# Patient Record
Sex: Female | Born: 1980 | Race: Black or African American | Hispanic: No | Marital: Single | Smoking: Never smoker
Health system: Southern US, Community
[De-identification: ages and names within clinical notes are randomized; demographics above are authoritative.]

---

## 2004-01-30 ENCOUNTER — Encounter (INDEPENDENT_AMBULATORY_CARE_PROVIDER_SITE_OTHER): Payer: Self-pay | Admitting: Specialist

## 2004-01-30 ENCOUNTER — Ambulatory Visit: Payer: Self-pay | Admitting: Obstetrics & Gynecology

## 2004-02-02 ENCOUNTER — Ambulatory Visit (HOSPITAL_COMMUNITY): Admission: RE | Admit: 2004-02-02 | Discharge: 2004-02-02 | Payer: Self-pay | Admitting: *Deleted

## 2004-02-20 ENCOUNTER — Ambulatory Visit: Payer: Self-pay | Admitting: Obstetrics & Gynecology

## 2004-10-01 ENCOUNTER — Inpatient Hospital Stay (HOSPITAL_COMMUNITY): Admission: AD | Admit: 2004-10-01 | Discharge: 2004-10-02 | Payer: Self-pay | Admitting: *Deleted

## 2004-10-31 ENCOUNTER — Ambulatory Visit: Payer: Self-pay | Admitting: Family Medicine

## 2004-11-18 ENCOUNTER — Ambulatory Visit: Payer: Self-pay | Admitting: Family Medicine

## 2004-12-01 ENCOUNTER — Ambulatory Visit: Payer: Self-pay | Admitting: *Deleted

## 2005-01-16 ENCOUNTER — Encounter (INDEPENDENT_AMBULATORY_CARE_PROVIDER_SITE_OTHER): Payer: Self-pay | Admitting: *Deleted

## 2005-01-16 ENCOUNTER — Ambulatory Visit: Payer: Self-pay | Admitting: Obstetrics & Gynecology

## 2005-03-10 ENCOUNTER — Inpatient Hospital Stay (HOSPITAL_COMMUNITY): Admission: AD | Admit: 2005-03-10 | Discharge: 2005-03-10 | Payer: Self-pay | Admitting: Obstetrics & Gynecology

## 2005-04-07 ENCOUNTER — Inpatient Hospital Stay (HOSPITAL_COMMUNITY): Admission: AD | Admit: 2005-04-07 | Discharge: 2005-04-08 | Payer: Self-pay | Admitting: Obstetrics & Gynecology

## 2005-05-27 ENCOUNTER — Ambulatory Visit (HOSPITAL_COMMUNITY): Admission: RE | Admit: 2005-05-27 | Discharge: 2005-05-27 | Payer: Self-pay | Admitting: *Deleted

## 2005-07-21 ENCOUNTER — Ambulatory Visit: Payer: Self-pay | Admitting: Obstetrics and Gynecology

## 2005-07-21 ENCOUNTER — Inpatient Hospital Stay (HOSPITAL_COMMUNITY): Admission: AD | Admit: 2005-07-21 | Discharge: 2005-07-21 | Payer: Self-pay | Admitting: *Deleted

## 2005-10-28 ENCOUNTER — Inpatient Hospital Stay (HOSPITAL_COMMUNITY): Admission: AD | Admit: 2005-10-28 | Discharge: 2005-10-28 | Payer: Self-pay | Admitting: Obstetrics and Gynecology

## 2005-10-29 ENCOUNTER — Ambulatory Visit: Payer: Self-pay | Admitting: Obstetrics & Gynecology

## 2005-10-29 ENCOUNTER — Inpatient Hospital Stay (HOSPITAL_COMMUNITY): Admission: AD | Admit: 2005-10-29 | Discharge: 2005-11-01 | Payer: Self-pay | Admitting: Gynecology

## 2005-10-29 ENCOUNTER — Encounter (INDEPENDENT_AMBULATORY_CARE_PROVIDER_SITE_OTHER): Payer: Self-pay | Admitting: *Deleted

## 2005-10-29 ENCOUNTER — Ambulatory Visit: Payer: Self-pay | Admitting: Neonatology

## 2006-11-10 ENCOUNTER — Ambulatory Visit (HOSPITAL_COMMUNITY): Admission: RE | Admit: 2006-11-10 | Discharge: 2006-11-10 | Payer: Self-pay | Admitting: Obstetrics & Gynecology

## 2006-11-24 ENCOUNTER — Ambulatory Visit (HOSPITAL_COMMUNITY): Admission: RE | Admit: 2006-11-24 | Discharge: 2006-11-24 | Payer: Self-pay | Admitting: Obstetrics & Gynecology

## 2006-12-31 IMAGING — US US OB LIMITED
1 series · 14 of 28 positions shown · non-contrast
Comparison: none

CLINICAL DATA: Post dates.  40 week 1 day assigned gestational age.  Vaginal bleeding.  Evaluate placenta.

[Series 1: us ob limited · 14 of 29 slices shown]
[im 2/29]
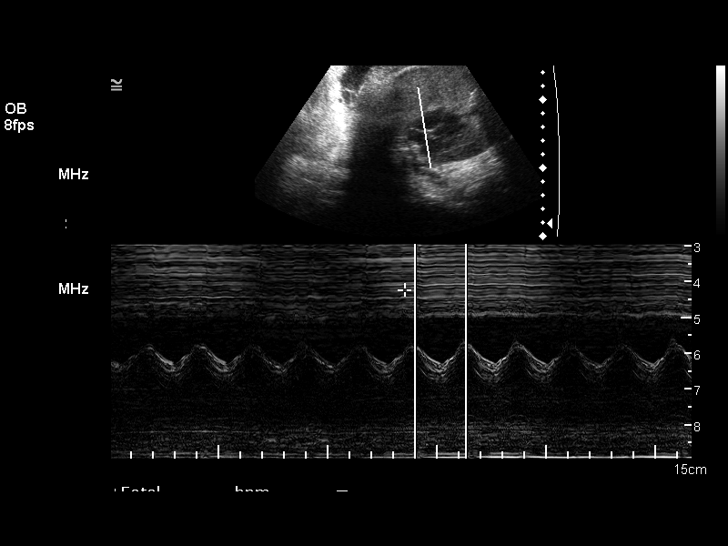
[im 4/29]
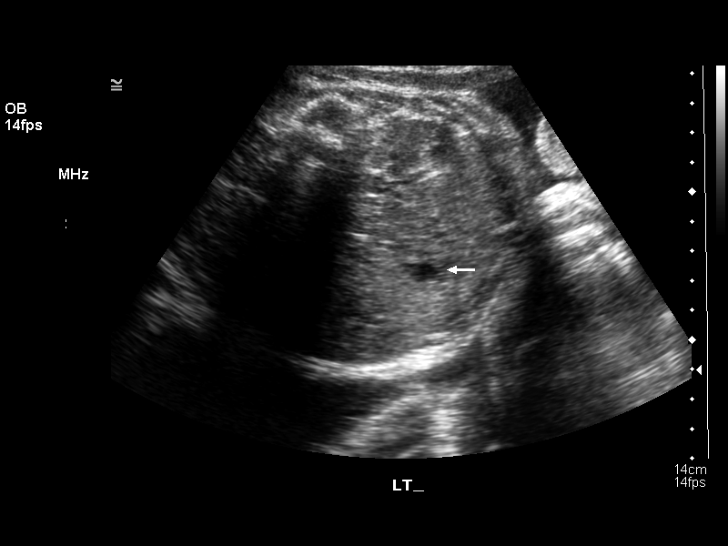
[im 6/29]
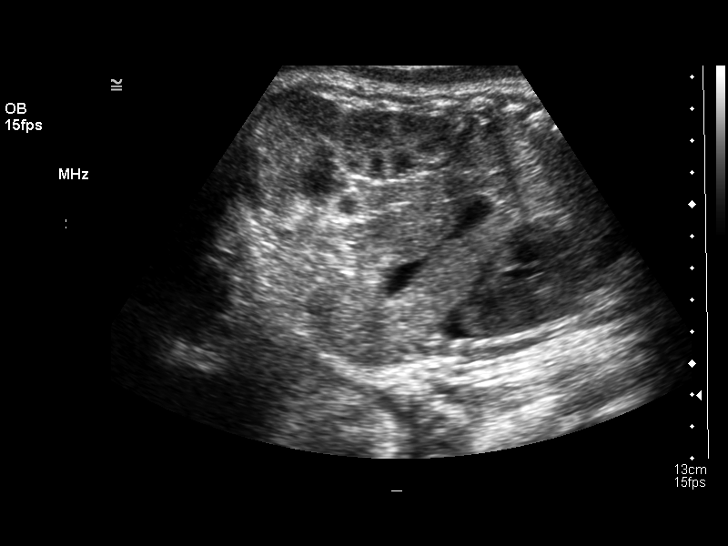
[im 8/29]
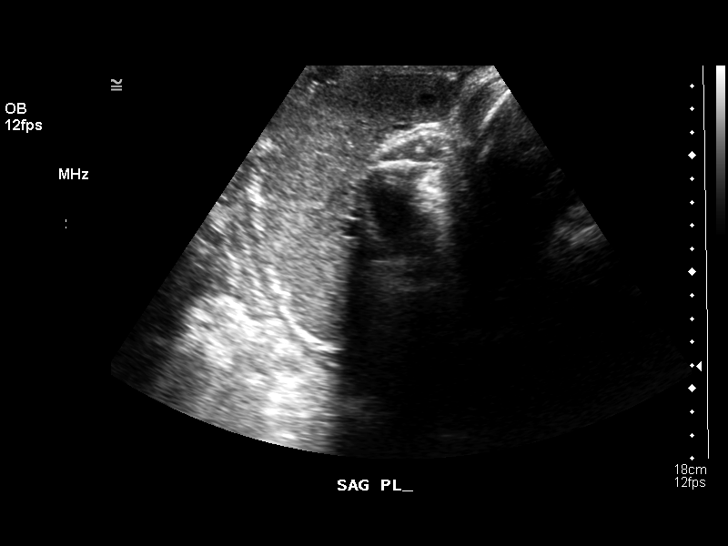
[im 10/29]
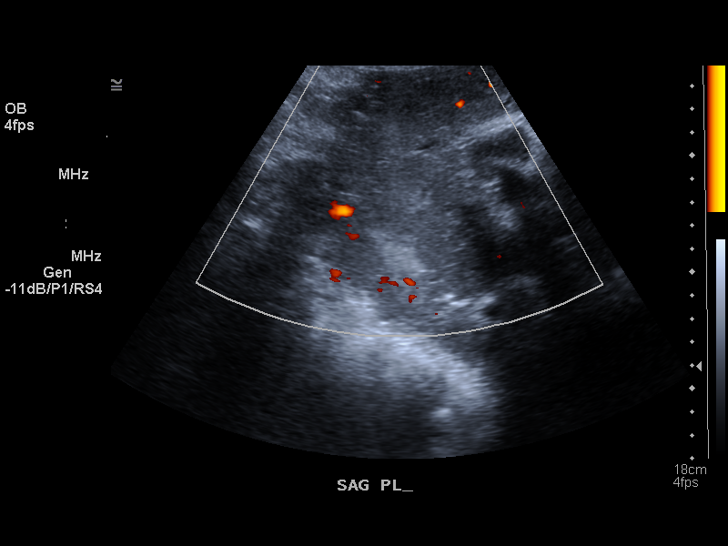
[im 12/29]
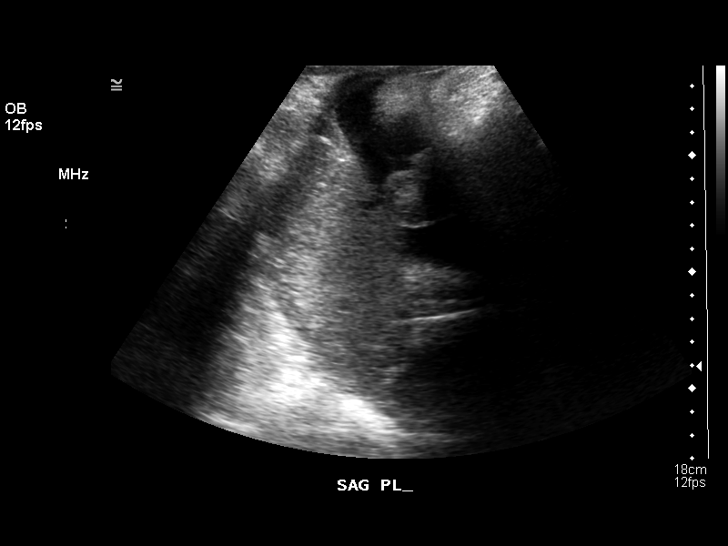
[im 14/29]
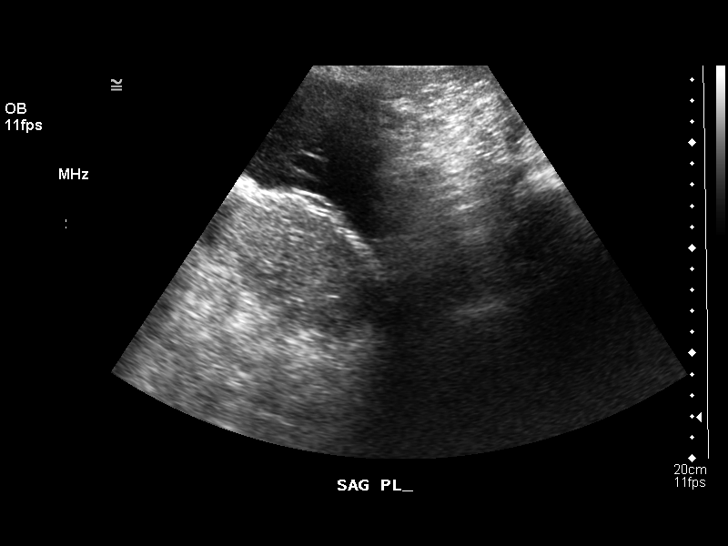
[im 16/29]
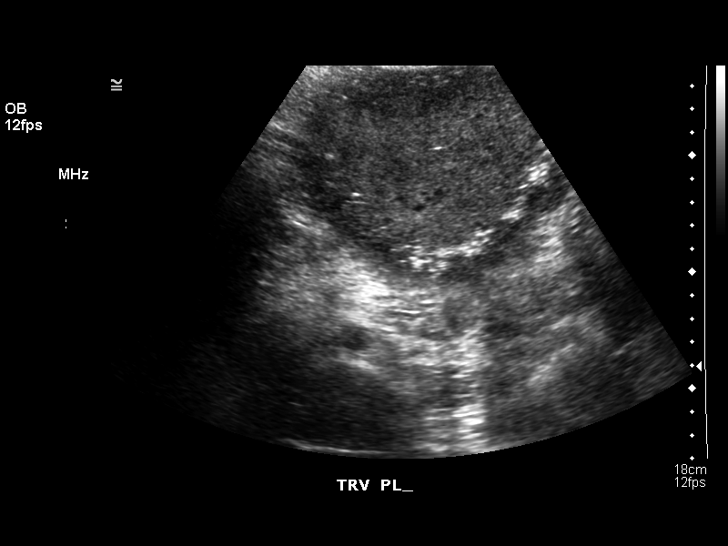
[im 18/29]
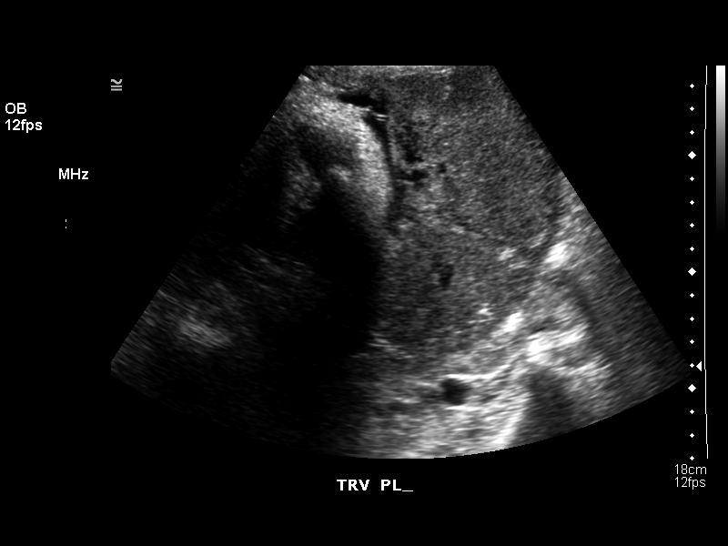
[im 20/29]
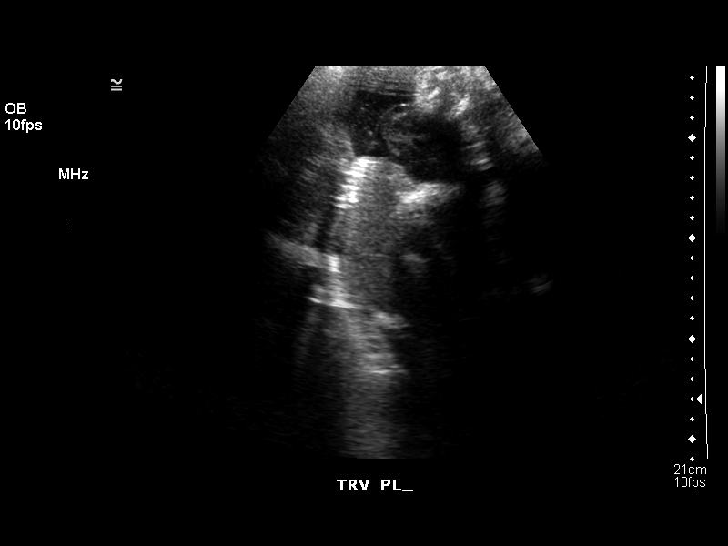
[im 22/29]
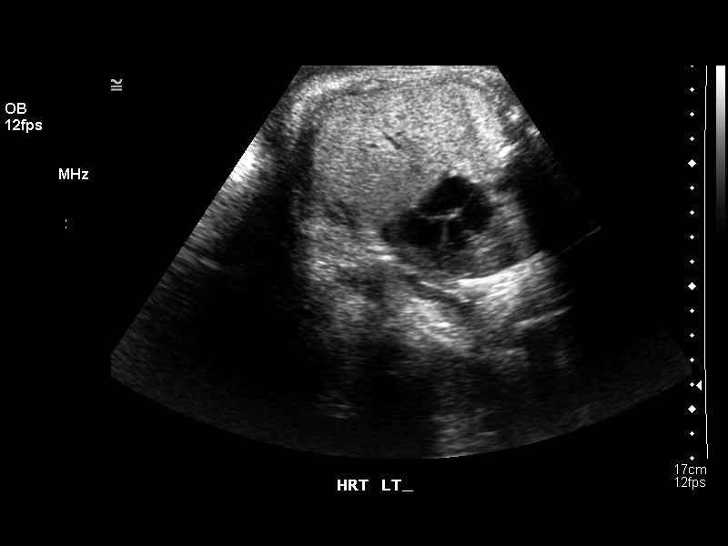
[im 24/29]
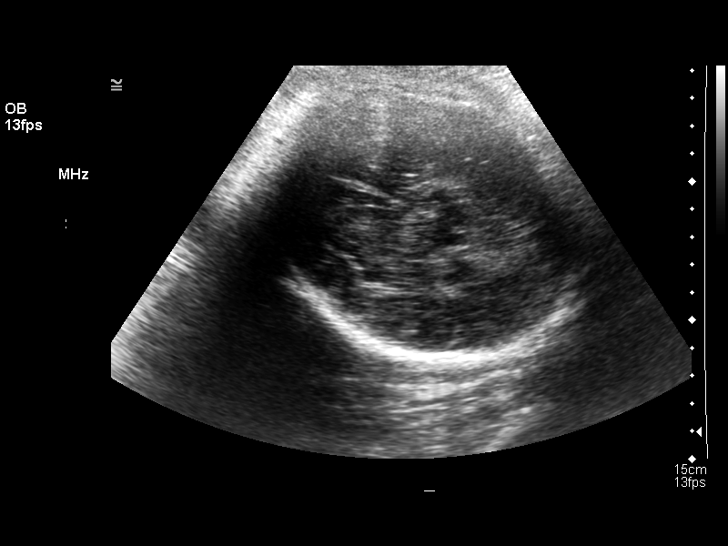
[im 26/29]
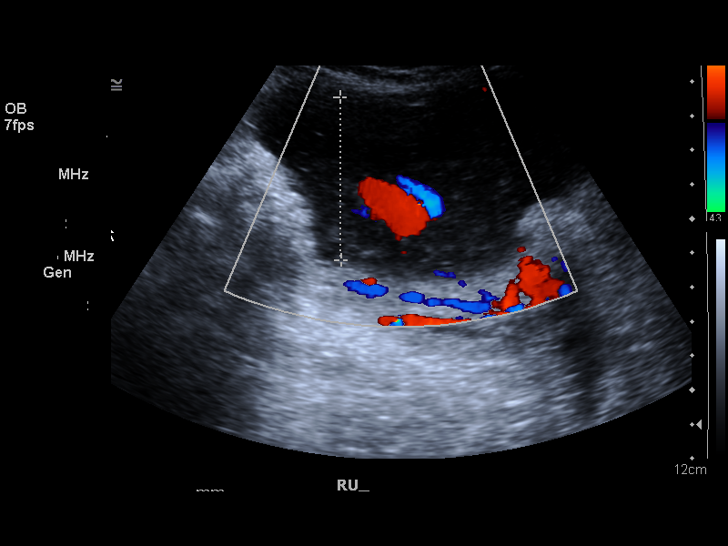
[im 29/29]
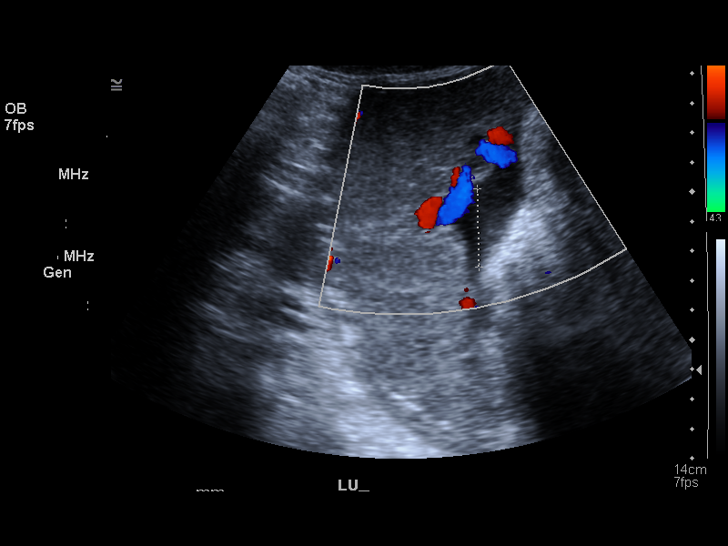

[14 of 28 positions shown; findings below may reference images not displayed]

LIMITED OBSTETRICAL ULTRASOUND:
 Number of Fetuses:  1
 Heart Rate:  128 bpm
 Movement:  Yes
 Breathing:  Yes
 Presentation:  Cephalic
 Placental Location:  Fundal, posterior
 Grade:  II
 Previa:  No
 Amniotic Fluid (Subjective):  Normal
 Amniotic Fluid (Objective):  AFI 13.4 cm (1th-61th %ile = 7.1 to 21.4 cm for 40 weeks) 

 Fetal measurements and complete anatomic evaluation were not requested.  The following fetal anatomy was visualized during this exam:  Lateral ventricles, thalami/CSP, stomach, 3-vessel cord, kidneys, bladder, and diaphragm.   

 MATERNAL UTERINE AND ADNEXAL FINDINGS
 Cervix:  Not evaluated; >34 weeks.
IMPRESSION: 1.  Single living intrauterine fetus in cephalic presentation.  
 2.  Placenta is posterior and fundal in location.  No evidence of placenta previa or abruption.

## 2007-04-04 ENCOUNTER — Ambulatory Visit: Payer: Self-pay | Admitting: Obstetrics and Gynecology

## 2007-04-04 ENCOUNTER — Inpatient Hospital Stay (HOSPITAL_COMMUNITY): Admission: AD | Admit: 2007-04-04 | Discharge: 2007-04-06 | Payer: Self-pay | Admitting: Obstetrics and Gynecology

## 2008-03-08 ENCOUNTER — Inpatient Hospital Stay (HOSPITAL_COMMUNITY): Admission: AD | Admit: 2008-03-08 | Discharge: 2008-03-09 | Payer: Self-pay | Admitting: Obstetrics and Gynecology

## 2008-04-17 ENCOUNTER — Ambulatory Visit (HOSPITAL_COMMUNITY): Admission: RE | Admit: 2008-04-17 | Discharge: 2008-04-17 | Payer: Self-pay | Admitting: Obstetrics & Gynecology

## 2008-05-15 ENCOUNTER — Ambulatory Visit (HOSPITAL_COMMUNITY): Admission: RE | Admit: 2008-05-15 | Discharge: 2008-05-15 | Payer: Self-pay | Admitting: *Deleted

## 2008-05-15 ENCOUNTER — Ambulatory Visit: Payer: Self-pay | Admitting: Obstetrics & Gynecology

## 2008-05-15 ENCOUNTER — Encounter: Payer: Self-pay | Admitting: Obstetrics and Gynecology

## 2008-05-15 LAB — CONVERTED CEMR LAB
MCHC: 33.8 g/dL (ref 30.0–36.0)
MCV: 86.6 fL (ref 78.0–100.0)
Platelets: 107 10*3/uL — ABNORMAL LOW (ref 150–400)
RDW: 13 % (ref 11.5–15.5)
WBC: 6.2 10*3/uL (ref 4.0–10.5)

## 2008-05-22 ENCOUNTER — Ambulatory Visit: Payer: Self-pay | Admitting: Family Medicine

## 2008-06-05 ENCOUNTER — Ambulatory Visit: Payer: Self-pay | Admitting: Obstetrics & Gynecology

## 2008-06-26 ENCOUNTER — Ambulatory Visit: Payer: Self-pay | Admitting: Obstetrics & Gynecology

## 2008-06-26 ENCOUNTER — Encounter: Payer: Self-pay | Admitting: Obstetrics and Gynecology

## 2008-06-26 LAB — CONVERTED CEMR LAB
MCHC: 33.6 g/dL (ref 30.0–36.0)
Platelets: 108 10*3/uL — ABNORMAL LOW (ref 150–400)
RDW: 12.9 % (ref 11.5–15.5)

## 2008-07-10 ENCOUNTER — Ambulatory Visit: Payer: Self-pay | Admitting: Obstetrics & Gynecology

## 2008-07-17 ENCOUNTER — Ambulatory Visit: Payer: Self-pay | Admitting: Obstetrics & Gynecology

## 2008-07-17 LAB — CONVERTED CEMR LAB
Chlamydia, DNA Probe: NEGATIVE
GC Probe Amp, Genital: NEGATIVE

## 2008-07-18 ENCOUNTER — Encounter: Payer: Self-pay | Admitting: Obstetrics & Gynecology

## 2008-07-24 ENCOUNTER — Encounter: Payer: Self-pay | Admitting: Obstetrics and Gynecology

## 2008-07-24 ENCOUNTER — Ambulatory Visit: Payer: Self-pay | Admitting: Obstetrics & Gynecology

## 2008-07-24 LAB — CONVERTED CEMR LAB
MCHC: 34.7 g/dL (ref 30.0–36.0)
Platelets: 113 10*3/uL — ABNORMAL LOW (ref 150–400)
RDW: 13.1 % (ref 11.5–15.5)

## 2008-07-31 ENCOUNTER — Ambulatory Visit: Payer: Self-pay | Admitting: Obstetrics & Gynecology

## 2008-08-03 ENCOUNTER — Ambulatory Visit: Payer: Self-pay | Admitting: Obstetrics & Gynecology

## 2008-08-03 ENCOUNTER — Inpatient Hospital Stay (HOSPITAL_COMMUNITY): Admission: AD | Admit: 2008-08-03 | Discharge: 2008-08-05 | Payer: Self-pay | Admitting: Obstetrics & Gynecology

## 2009-06-20 IMAGING — US US OB FOLLOW-UP
1 series · 14 of 28 positions shown · non-contrast
Comparison: none

OBSTETRICAL ULTRASOUND:
 This ultrasound exam was performed in the [HOSPITAL] Ultrasound Department.  The OB US report was generated in the AS system, and faxed to the ordering physician.  This report is also available in [REDACTED] PACS.

[Series 1: us ob follow up · 14 of 53 slices shown]
[im 2/53]
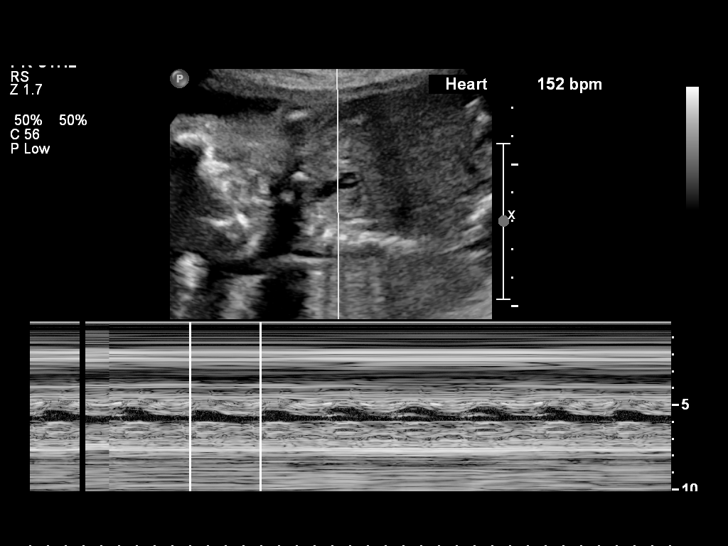
[im 6/53]
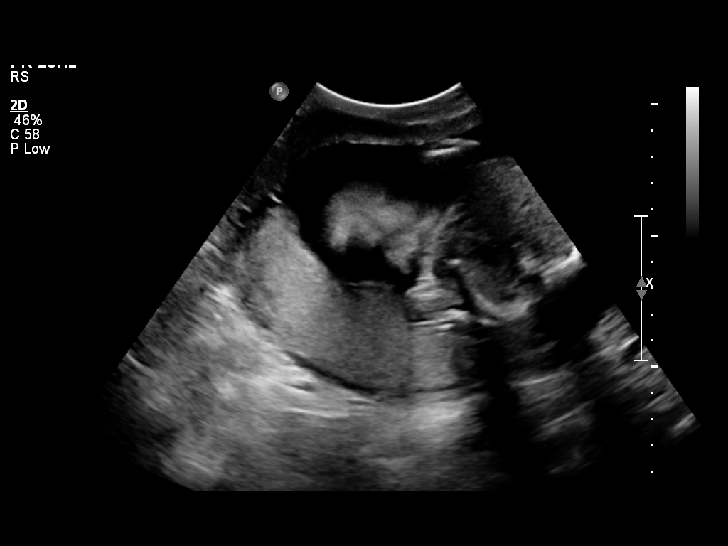
[im 10/53]
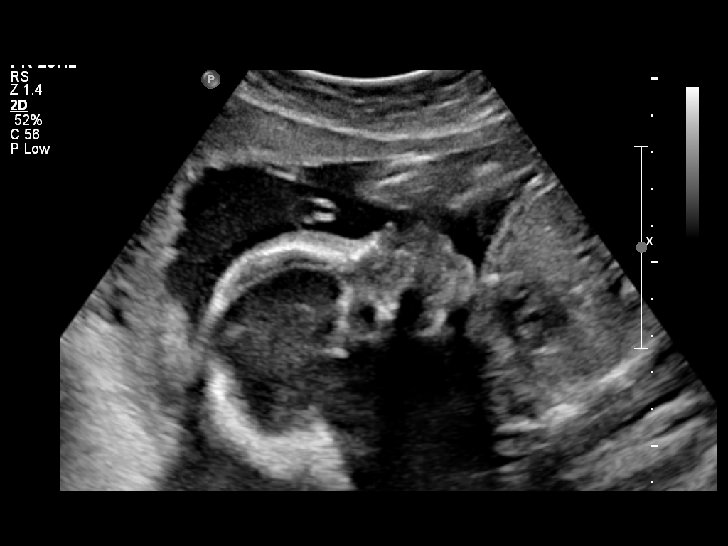
[im 14/53]
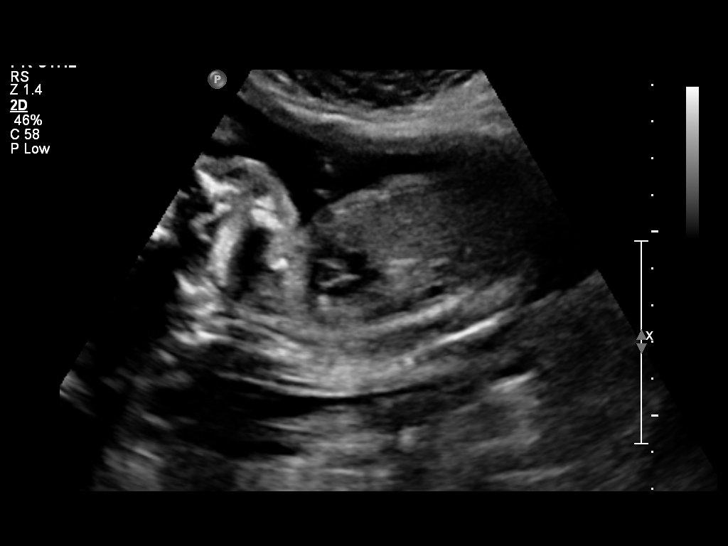
[im 18/53]
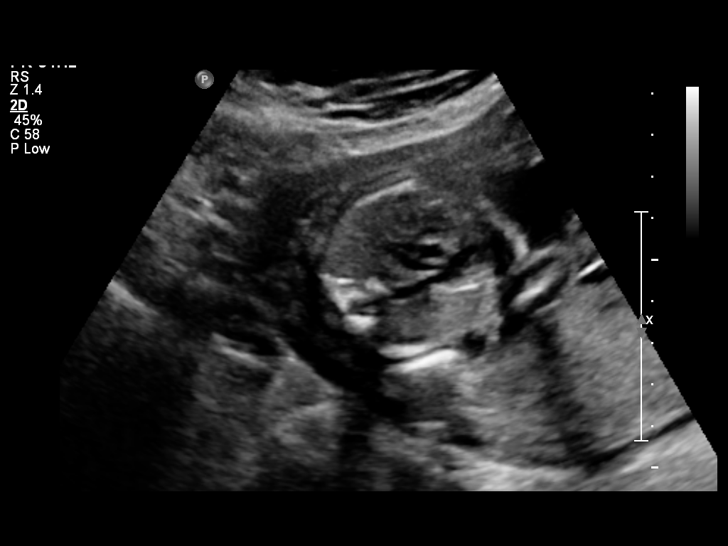
[im 22/53]
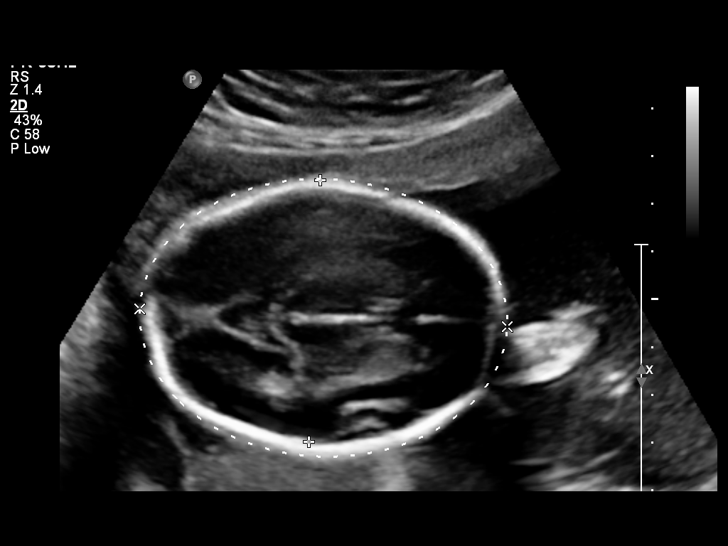
[im 26/53]
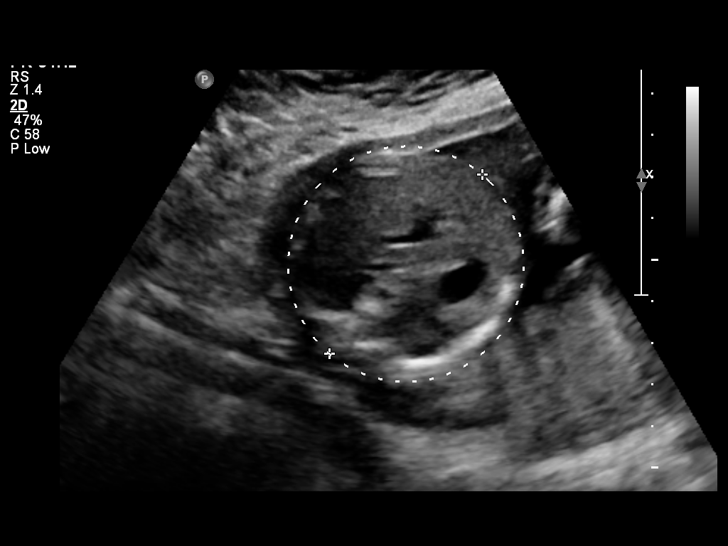
[im 29/53]
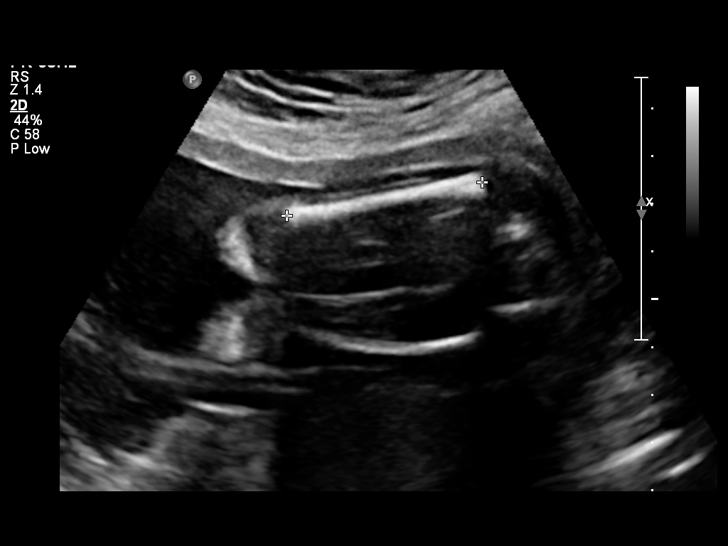
[im 33/53]
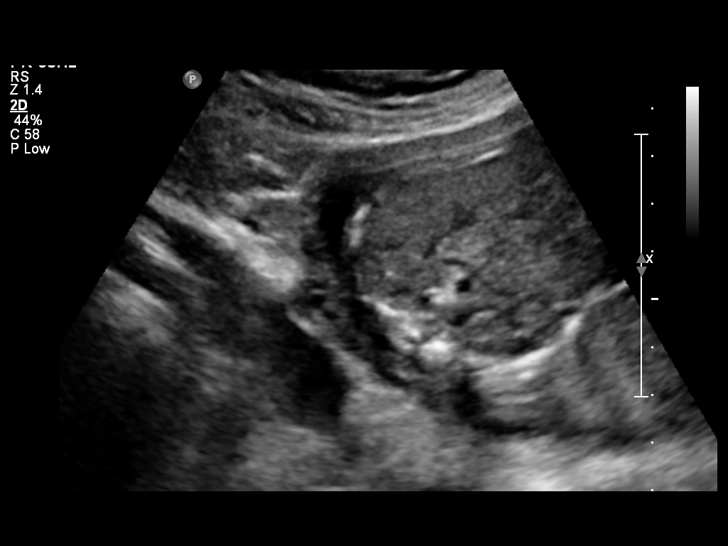
[im 37/53]
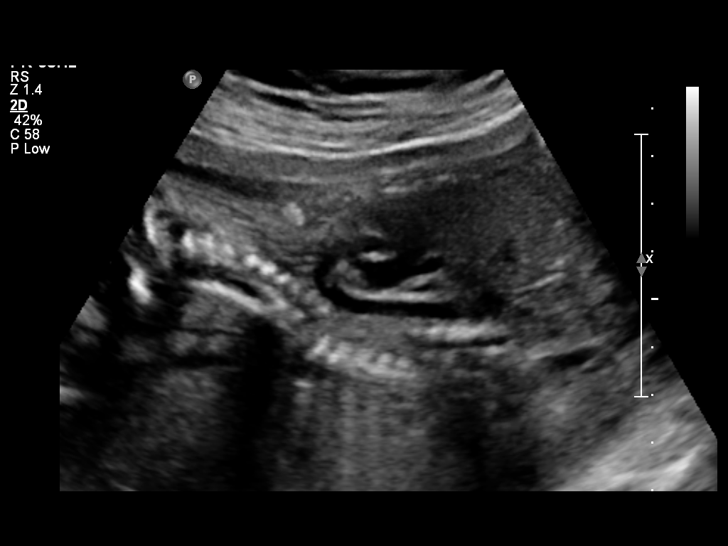
[im 41/53]
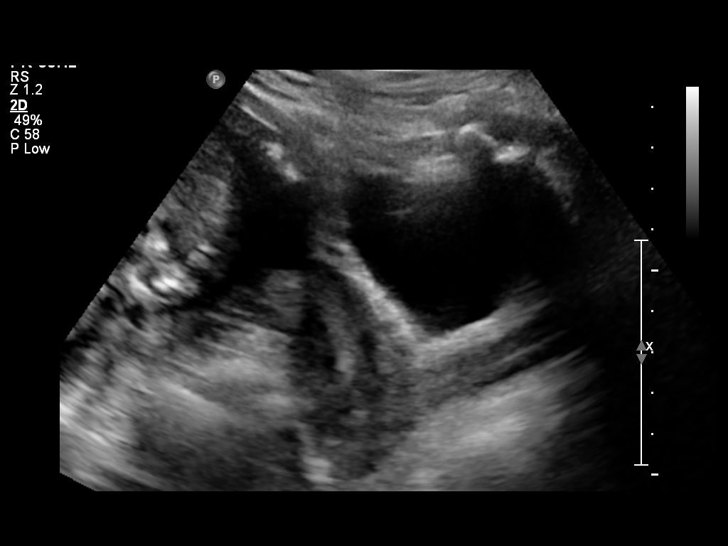
[im 45/53]
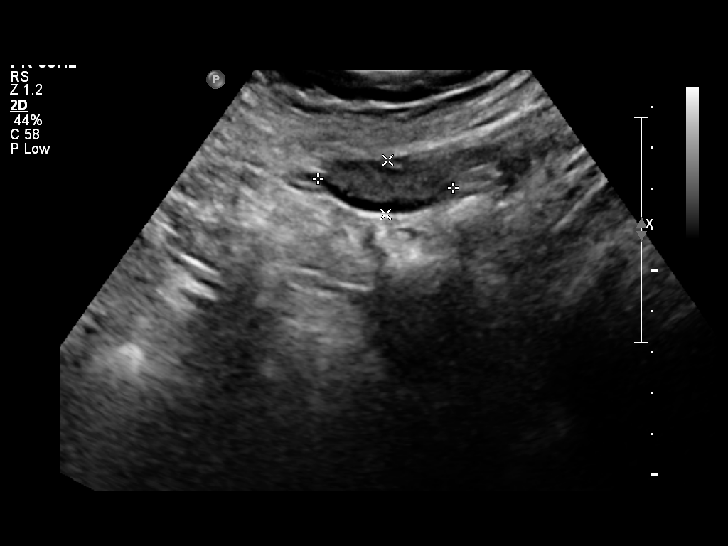
[im 49/53]
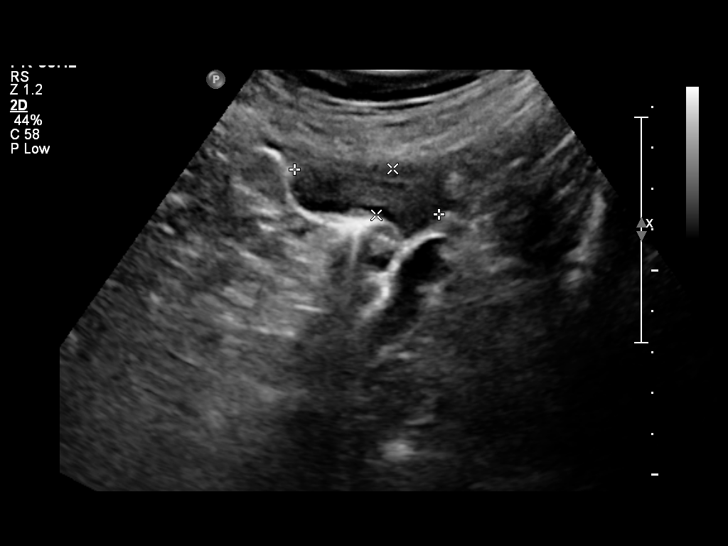
[im 53/53]
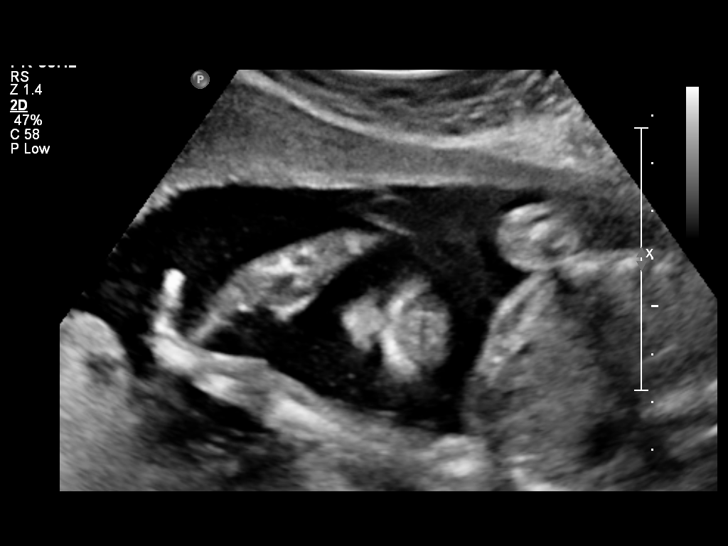

[14 of 28 positions shown; findings below may reference images not displayed]

IMPRESSION: See AS Obstetric US report.

## 2010-04-28 LAB — POCT URINALYSIS DIP (DEVICE)
Bilirubin Urine: NEGATIVE
Bilirubin Urine: NEGATIVE
Glucose, UA: NEGATIVE mg/dL
Ketones, ur: NEGATIVE mg/dL
Nitrite: NEGATIVE
Nitrite: NEGATIVE
Protein, ur: NEGATIVE mg/dL
Urobilinogen, UA: 0.2 mg/dL (ref 0.0–1.0)

## 2010-04-29 LAB — POCT URINALYSIS DIP (DEVICE)
Bilirubin Urine: NEGATIVE
Bilirubin Urine: NEGATIVE
Glucose, UA: NEGATIVE mg/dL
Glucose, UA: NEGATIVE mg/dL
Glucose, UA: NEGATIVE mg/dL
Hgb urine dipstick: NEGATIVE
Ketones, ur: NEGATIVE mg/dL
Ketones, ur: NEGATIVE mg/dL
Nitrite: NEGATIVE
Nitrite: NEGATIVE
Protein, ur: NEGATIVE mg/dL
Specific Gravity, Urine: 1.02 (ref 1.005–1.030)
Urobilinogen, UA: 0.2 mg/dL (ref 0.0–1.0)
Urobilinogen, UA: 0.2 mg/dL (ref 0.0–1.0)
pH: 6 (ref 5.0–8.0)

## 2010-04-30 LAB — POCT URINALYSIS DIP (DEVICE)
Bilirubin Urine: NEGATIVE
Bilirubin Urine: NEGATIVE
Glucose, UA: NEGATIVE mg/dL
Glucose, UA: NEGATIVE mg/dL
Hgb urine dipstick: NEGATIVE
Ketones, ur: NEGATIVE mg/dL
Ketones, ur: NEGATIVE mg/dL
Protein, ur: NEGATIVE mg/dL
Specific Gravity, Urine: 1.015 (ref 1.005–1.030)
Specific Gravity, Urine: 1.015 (ref 1.005–1.030)
Urobilinogen, UA: 0.2 mg/dL (ref 0.0–1.0)

## 2010-05-01 LAB — POCT URINALYSIS DIP (DEVICE)
Bilirubin Urine: NEGATIVE
Glucose, UA: NEGATIVE mg/dL
Hgb urine dipstick: NEGATIVE
Ketones, ur: NEGATIVE mg/dL
Nitrite: NEGATIVE
pH: 6.5 (ref 5.0–8.0)

## 2010-05-07 LAB — DIFFERENTIAL
Basophils Relative: 0 % (ref 0–1)
Eosinophils Absolute: 0.1 10*3/uL (ref 0.0–0.7)
Neutro Abs: 6 10*3/uL (ref 1.7–7.7)
Neutrophils Relative %: 67 % (ref 43–77)

## 2010-05-07 LAB — RPR: RPR Ser Ql: NONREACTIVE

## 2010-05-07 LAB — URINALYSIS, ROUTINE W REFLEX MICROSCOPIC
Hgb urine dipstick: NEGATIVE
Nitrite: NEGATIVE
Specific Gravity, Urine: 1.015 (ref 1.005–1.030)
Urobilinogen, UA: 0.2 mg/dL (ref 0.0–1.0)
pH: 6.5 (ref 5.0–8.0)

## 2010-05-07 LAB — CBC
MCHC: 33.5 g/dL (ref 30.0–36.0)
MCV: 88.8 fL (ref 78.0–100.0)
Platelets: 108 10*3/uL — ABNORMAL LOW (ref 150–400)
WBC: 8.9 10*3/uL (ref 4.0–10.5)

## 2010-05-07 LAB — TYPE AND SCREEN
ABO/RH(D): O POS
Antibody Screen: NEGATIVE

## 2010-05-07 LAB — GC/CHLAMYDIA PROBE AMP, GENITAL
Chlamydia, DNA Probe: NEGATIVE
GC Probe Amp, Genital: NEGATIVE

## 2010-05-07 LAB — HIV ANTIBODY (ROUTINE TESTING W REFLEX): HIV: NONREACTIVE

## 2010-05-07 LAB — WET PREP, GENITAL: Trich, Wet Prep: NONE SEEN

## 2010-06-07 NOTE — Op Note (Signed)
[name] [name], [name] [name]                  ACCOUNT NO.:  [id number]   MEDICAL RECORD NO.:  [id number]          PATIENT TYPE:  INP   LOCATION:  9128                          [facility]   PHYSICIAN:  Lesly Dukes, M.D. DATE OF BIRTH:  Feb 12, 1980   DATE OF PROCEDURE:  10/29/2005  DATE OF DISCHARGE:                                 OPERATIVE REPORT   PREOPERATIVE DIAGNOSIS:  A 30 year old G2, para 0-0-1-0, at term with failed  forceps.   POSTOPERATIVE DIAGNOSIS:  A 30 year old G2, para 0-0-1-0, at term with  failed forceps.   PROCEDURE:  Primary low transverse cesarean section.   SURGEON:  Lesly Dukes, M.D.   ASSISTANT:  Ginger Carne, MD   ANESTHESIA:  Epidural.   SPECIMENS:  Placenta to pathology.   ESTIMATED BLOOD LOSS:  800 mL.   COMPLICATIONS:  None.   FINDINGS:  A viable female infant, Apgars 9 at one minute and 9 at five  minutes, vertex occiput anterior presentation.  Clear fluid, with a normal  uterus, ovaries and fallopian tubes.  There was also a Bandl ring on the  uterus.   PROCEDURE:  After informed consent was obtained, the patient was taken to  the operating room, where epidural anesthesia was found to be adequate.  The  patient was placed in the dorsal lithotomy position with a leftward tilt and  prepared and draped in the normal sterile fashion.  A Foley was in the  bladder.  A Pfannenstiel skin incision was made with a scalpel and carried  down to the fascia.  The fascia was incised in the midline and extended  bilaterally.  The superior and inferior aspects of the fascial incision were  grasped with Kocher clamps, tented up, and dissected off sharply and bluntly  from the underlying layers of the rectus muscle.  The rectus muscles were  separated in the midline.  The peritoneum was entered bluntly and extended  both superiorly and inferiorly with good visualization of the bladder.  The  bladder blade was inserted.  The bladder flap was created and  the bladder  blade was reinserted.  The uterine incision was made in a transverse fashion  in the lower uterine segment.  There was a Bandl ring, as previously noted.  The head delivered atraumatically.  The nose and mouth were suctioned, the  rest of the baby's body was delivered atraumatically and the cord was  clamped and cut and the baby was handed to the awaiting pediatrician.  Arterial umbilical cord gas was sent.  The placenta was delivered manually  and sent to pathology.  The uterus was cleared of all clots and debris.  There was mild atony, and Methergine and a Pitocin bolus were given.  The  uterus was closed with 0 Vicryl in a running locked fashion and a second  suture of 0 Vicryl was used to reinforce the incision and aid hemostasis.  The incision was noted to be hemostatic off tension.  The peritoneal cavity  was copiously irrigated and hemostasis was noted on the uterus one last  time.  The rectus muscles and peritoneum were noted to be hemostatic.  The  bladder flap was hemostatic.  The fascia was closed with 0 Vicryl in a  running fashion.  The subcutaneous tissue was copiously irrigated and found  to be hemostatic.  The skin was closed with staples.  The patient tolerated  the procedure well.  The sponge, lap, instrument and needle count were  correct x2.  The patient went to recovery in stable condition.           ______________________________  Lesly Dukes, M.D.     KHL/MEDQ  D:  10/29/2005  T:  10/31/2005  Job:  [id number]

## 2010-06-07 NOTE — Discharge Summary (Signed)
[name] [name], [name] [name]                  ACCOUNT NO.:  [id number]   MEDICAL RECORD NO.:  [id number]          PATIENT TYPE:  INP   LOCATION:  9128                          [facility]   PHYSICIAN:  Tanya S. Shawnie Pons, M.D.   DATE OF BIRTH:  January 28, 1980   DATE OF ADMISSION:  10/29/2005  DATE OF DISCHARGE:  11/01/2005                                 DISCHARGE SUMMARY   ADMITTING ATTENDING:  Ginger Carne, M.D.   DISCHARGE ATTENDING:  Shelbie Proctor. Shawnie Pons, M.D.   REASON FOR ADMISSION:  Active labor at [redacted] weeks gestational age.   DISCHARGE DIAGNOSES:  1. Status post primary low transverse cesarean section for failed forceps      of 40 week 2 day pregnancy.  2. Thrombocytopenia.  3. Hepatitis B surface antigen positive.  4. Anemia secondary to acute blood loss.   LABORATORY DATA:  Hepatitis B surface antigen positive.  Hepatitis B core  antibody positive.  Hepatitis B surface antibody negative.   HOSPITAL COURSE:  The patient is a 30 year old, G2, P0-0-1-0 at 40 weeks, 2  days, dated by LMP and 7-week ultrasound, who presented in active labor.  Initial sterile vaginal exam was 3, 80 and -2.  The patient was admitted and  augmented with Pitocin and had artificial rupture of membranes.  She  progressed to complete at 1441.  After pushing for 1 hour, she was at 0  station.  After another hour of pushing, she was at 0 to +1 station.  After  yet another hour of pushing, she did not descend any more.  The patient was  counseled on the risks and benefits and wanted to proceed with forceps  delivery.  Dr. Mia Creek attempted __________ forceps and was unsuccessful.  She continued to refuse cesarean section following that.  Then, the patient  ultimately did decide to proceed to cesarean section.  Please see operative  note for full details.  The patient also developed chorioamnionitis and was  treated with ampicillin and gentamicin.  This was continued for 24 hours  postpartum.  The postoperative  course was otherwise unremarkable.  Other  issues that were found during this hospitalization included that she was  noted to have low platelets during her pregnancy.  Her admit platelets were  97.  Repeat were 83.  Her bleeding remained stable.  Her work-up as an  outpatient had been negative.  She is also hepatitis B surface antigen  positive.  Nursery was notified of this, and the baby did get immune  globulin and vaccinated during this hospitalization.  Hepatitis core  antibody was positive which can indicate a chronic infection.  The IgG and  IgM are pending at the time of this dictation.  Hepatitis surface antigen  antibody was negative.  The patient was told that she needs to followup as  an outpatient.   DISPOSITION:  Home.   FOLLOWUP:  [facility] in 6 weeks.   DISCHARGE INSTRUCTIONS:  No heavy lifting.  Nothing per vagina x6 weeks.  Return to [facility] if positive symptoms of infection.  The patient was again  encouraged to followup on the hepatitis B issue as an outpatient.   DISCHARGE MEDICATIONS:  1. Percocet 5, 1-2 p.o. q.4-6h.  2. Ibuprofen 600 mg q.6h. p.r.n.  3. Colace 100 mg p.o. b.i.d.  4. Prenatal vitamins one daily.     ______________________________  Marc Morgans Mayford Knife, M.D.    ______________________________  Shelbie Proctor. Shawnie Pons, M.D.    TLW/MEDQ  D:  11/01/2005  T:  11/03/2005  Job:  [id number]

## 2010-06-07 NOTE — Group Therapy Note (Signed)
NAME:  [name] [name], [name] [name]                  ACCOUNT NO.:  [id number]   MEDICAL RECORD NO.:  [id number]          PATIENT TYPE:  WOC   LOCATION:  [facility]                   FACILITY:  [facility]   PHYSICIAN:  Elsie Lincoln, MD      DATE OF BIRTH:  November 14, 1980   DATE OF SERVICE:  01/17/2005                                    CLINIC NOTE   Patient is a 30 year old female, who was lost to followup for some time [place]  [place].  We do have records from this.  She  had a negative beta hCG in January and is also noted to have a low-grade Pap  smear that she never came for a colposcopy.  She has monthly periods and is  trying to get pregnant.  However, they only have sex once a month.  We did  talk about timing of sex and gave them explicit instructions about sex every  other day starting day 12 of the cycle.  We also will do a semen analysis on  her husband.  I will hold off on doing an HSG on the patient until she has  enough intercourse.  The patient needs a beta hCG today; however, she did  end up leaving before this was done.  We did do a Pap smear today for a  history of low-grade a year ago, and she understands that if this is  abnormal, she will need colposcopy.  The patient is to come back in 6 weeks  to see how things are going.  We will call her about getting a beta hCG  before trying to become pregnant.           ______________________________  Elsie Lincoln, MD     KL/MEDQ  D:  01/17/2005  T:  01/18/2005  Job:  [id number]

## 2010-06-07 NOTE — Group Therapy Note (Signed)
[name] [name]                  ACCOUNT NO.:  [id number]   MEDICAL RECORD NO.:  [id number]          PATIENT TYPE:  WOC   LOCATION:  [facility]                   FACILITY:  [facility]   PHYSICIAN:  Elsie Lincoln, MD      DATE OF BIRTH:  04/26/1980   DATE OF SERVICE:  01/30/2004                                    CLINIC NOTE   REASON FOR VISIT:  The patient is a 30 year old G1 para 0-0-1-0 female who  presents to [place] after treatment for a molar pregnancy in [place], [place].  The patient has pathologic diagnosis of such and records are present.  The  patient had a negative chest x-ray which we have a copy of that report as  well.  The patient denies receiving any chemotherapy for this.  She was  followed with serial betas.  She was 332,980 on Jun 02, 2003.  On July 28, 2003 she was 24.  On August 05, 2003 she was 9.  This is considered negative  by their laboratory standards and limits of normal.  On August 16, 2003 it was  40 which is not considered negative.  This is worrisome because it does  require two negative results in order to be considered resolved.  The  patient moved from [place] to here in August and this is her first time  presenting to our clinic.  The patient states she gets a monthly period that  only lasts 1 day.  Her last menstrual period is January 18, 2004.  She does  complain of bloating and pain during her menses but not in between.  The  patient wants to get pregnant and is presenting today for evaluation.   PAST MEDICAL HISTORY:  Denies.   PAST SURGICAL HISTORY:  D&C.   PAST GYNECOLOGICAL HISTORY:  No ovarian cysts, fibroid tumors, sexually-  transmitted diseases.  She does have an ASCUS Pap last summer 2005.  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  She has never had colposcopy that I am aware  of.   ALLERGIES:  Denies.   MEDICATIONS:  None.   CONTRACEPTION:  Condoms.   PHYSICAL EXAMINATION:  VITAL SIGNS:  Pulse 86, blood pressure 102/67, weight  141.9, height 5  feet 6 inches.  ABDOMEN:  Soft, nontender, nondistended.  VAGINA:  Pink, small amount of discharge consistent with yeast.  However,  the patient complains of no discharge.  Cervix closed, nontender.  Uterus  small, retroverted, nontender.  Adnexa mildly tender on the left but not on  the right, no masses palpated.   ASSESSMENT AND PLAN:  A 30 year old gravida 1 para 0-0-1-0 with history of a  molar pregnancy and has not had two documented negative beta hCGs.   1.  Beta hCG today.  2.  CBC today.  The patient has a history of thrombocytopenia with patient's      of 114 on a CBC done in [place].  3.  ASCUS Pap with high risk HPV positive; repeat Pap today.  4.  Transvaginal ultrasound scheduled to evaluate uterus and adnexa.  5.  Return to me in  3 weeks.      KL/MEDQ  D:  01/30/2004  T:  01/30/2004  Job:  [id number]

## 2010-10-14 LAB — CBC
Hemoglobin: 14.4
RBC: 4.73
RDW: 12.2

## 2010-10-14 LAB — RPR: RPR Ser Ql: NONREACTIVE

## 2016-05-16 ENCOUNTER — Ambulatory Visit (INDEPENDENT_AMBULATORY_CARE_PROVIDER_SITE_OTHER): Payer: Self-pay | Admitting: Physician Assistant

## 2016-05-16 ENCOUNTER — Encounter (INDEPENDENT_AMBULATORY_CARE_PROVIDER_SITE_OTHER): Payer: Self-pay | Admitting: Physician Assistant

## 2016-05-16 VITALS — BP 99/70 | HR 75 | Temp 97.9°F | Ht 64.0 in | Wt 228.8 lb

## 2016-05-16 DIAGNOSIS — M25562 Pain in left knee: Secondary | ICD-10-CM

## 2016-05-16 MED ORDER — ACETAMINOPHEN-CODEINE #3 300-30 MG PO TABS: 1.0000 | ORAL_TABLET | Freq: Three times a day (TID) | ORAL | 0 refills | Status: AC | PRN

## 2016-05-16 MED FILL — ACETAMINOPHEN/COD #3 TABLET: 300-30 | 15 days supply | Qty: 45 | Fill #0

## 2016-05-16 NOTE — Progress Notes (Signed)
  Subjective:  Patient ID: [name] [name], female    DOB: 10/25/1980  Age: 36 y.o. MRN: [id number]  CC: left knee pain  HPI [name] [name] is a 36 y.o. female presents with left knee pain. Went to ED on 04/28/16 and was thought to have a meniscal injury. No imaging done to confirm. Pt then saw Dr. Luiz Blare, orthopedic specialist, and gave a cortisone injection into left knee. Dr. Luiz Blare also thought she may have a meniscal injury. Imaging not done due to lack of insurance and funds. Pain has reduced 50% since the steroid injection. Says her swelling and partial numbness of the LLE have resolved. Reports no other symptoms.  ROS Review of Systems  Constitutional: Negative for chills, fever and malaise/fatigue.  Eyes: Negative for blurred vision.  Respiratory: Negative for shortness of breath.   Cardiovascular: Negative for chest pain and palpitations.  Gastrointestinal: Negative for abdominal pain and nausea.  Genitourinary: Negative for dysuria and hematuria.  Musculoskeletal: Positive for joint pain. Negative for myalgias.  Skin: Negative for rash.  Neurological: Negative for tingling and headaches.  Psychiatric/Behavioral: Negative for depression. The patient is not nervous/anxious.     Objective:  BP 99/70 (BP Location: Right Arm, Patient Position: Sitting, Cuff Size: Large)   Pulse 75   Temp 97.9 F (36.6 C) (Oral)   Ht  (1.626 m)   Wt 228 lb 12.8 oz (103.8 kg)   SpO2 100%   BMI 39.27 kg/m   BP/Weight 05/16/2016  Systolic BP 99  Diastolic BP 70  Wt. (Lbs) 228.8  BMI 39.27      Physical Exam  Constitutional: She is oriented to person, place, and time.  Well developed, obese, NAD, polite  HENT:  Head: Normocephalic and atraumatic.  Eyes: No scleral icterus.  Cardiovascular: Normal rate, regular rhythm and normal heart sounds.   Pulmonary/Chest: Effort normal and breath sounds normal.  Musculoskeletal: She exhibits tenderness (left knee with midline joint tenderness). She  exhibits no edema or deformity.  Thessaly test positive on left LE.  Neurological: She is alert and oriented to person, place, and time. No cranial nerve deficit. Coordination normal.  Skin: Skin is warm and dry. No rash noted. No erythema. No pallor.  Psychiatric: She has a normal mood and affect. Her behavior is normal. Thought content normal.  Vitals reviewed.    Assessment & Plan:   1. Acute pain of left knee - Suspected Meniscal tear. Has received cortisone injection. - Begin Tylenol 3.    Follow-up: 2 weeks for full physical   Loletta Specter PA

## 2016-05-16 NOTE — Patient Instructions (Signed)
Meniscus Tear A meniscus tear is a knee injury in which a piece of the meniscus is torn. The meniscus is a thick, rubbery, wedge-shaped cartilage in the knee. Two menisci are located in each knee. They sit between the upper bone (femur) and lower bone (tibia) that make up the knee joint. Each meniscus acts as a shock absorber for the knee. A torn meniscus is one of the most common types of knee injuries. This injury can range from mild to severe. Surgery may be needed for a severe tear. What are the causes? This injury may be caused by any squatting, twisting, or pivoting movement. Sports-related injuries are the most common cause. These often occur from:  Running and stopping suddenly.  Changing direction.  Being tackled or knocked off your feet. As people get older, their meniscus gets thinner and weaker. In these people, tears can happen more easily, such as from climbing stairs. What increases the risk? This injury is more likely to happen to:  People who play contact sports.  Males.  People who are 30?34 years of age. What are the signs or symptoms? Symptoms of this injury include:  Knee pain, especially at the side of the knee joint. You may feel pain when the injury occurs, or you may only hear a pop and feel pain later.  A feeling that your knee is clicking, catching, locking, or giving way.  Not being able to fully bend or extend your knee.  Bruising or swelling in your knee. How is this diagnosed? This injury may be diagnosed based on your symptoms and a physical exam. The physical exam may include:  Moving your knee in different ways.  Feeling for tenderness.  Listening for a clicking sound.  Checking if your knee locks or catches. You may also have tests, such as:  X-rays.  MRI.  A procedure to look inside your knee with a narrow surgical telescope (arthroscopy). You may be referred to a knee specialist (orthopedic surgeon). How is this treated? Treatment  for this injury depends on the severity of the tear. Treatment for a mild tear may include:  Rest.  Medicine to reduce pain and swelling. This is usually a nonsteroidal anti-inflammatory drug (NSAID).  A knee brace or an elastic sleeve or wrap.  Using crutches or a walker to keep weight off your knee and to help you walk.  Exercises to strengthen your knee (physical therapy). You may need surgery if you have a severe tear or if other treatments are not working. Follow these instructions at home: Managing pain and swelling   Take over-the-counter and prescription medicines only as told by your health care provider.  If directed, apply ice to the injured area:  Put ice in a plastic bag.  Place a towel between your skin and the bag.  Leave the ice on for 20 minutes, 2-3 times per day.  Raise (elevate) the injured area above the level of your heart while you are sitting or lying down. Activity   Do not use the injured limb to support your body weight until your health care provider says that you can. Use crutches or a walker as told by your health care provider.  Return to your normal activities as told by your health care provider. Ask your health care provider what activities are safe for you.  Perform range-of-motion exercises only as told by your health care provider.  Begin doing exercises to strengthen your knee and leg muscles only as told by your   health care provider. After you recover, your health care provider may recommend these exercises to help prevent another injury. General instructions   Use a knee brace or elastic wrap as told by your health care provider.  Keep all follow-up visits as told by your health care provider. This is important. Contact a health care provider if:  You have a fever.  Your knee becomes red, tender, or swollen.  Your pain medicine is not helping.  Your symptoms get worse or do not improve after 2 weeks of home care. This  information is not intended to replace advice given to you by your health care provider. Make sure you discuss any questions you have with your health care provider. Document Released: 03/29/2002 Document Revised: 06/14/2015 Document Reviewed: 05/01/2014 Elsevier Interactive Patient Education  2017 [employer]  

## 2016-06-04 ENCOUNTER — Encounter (INDEPENDENT_AMBULATORY_CARE_PROVIDER_SITE_OTHER): Payer: Self-pay | Admitting: Physician Assistant

## 2016-06-04 ENCOUNTER — Ambulatory Visit (INDEPENDENT_AMBULATORY_CARE_PROVIDER_SITE_OTHER): Payer: Self-pay | Admitting: Physician Assistant

## 2016-06-04 ENCOUNTER — Other Ambulatory Visit (HOSPITAL_COMMUNITY)
Admission: RE | Admit: 2016-06-04 | Discharge: 2016-06-04 | Disposition: A | Discharge status: Home / Self Care | Payer: Self-pay | Source: Ambulatory Visit | Attending: Physician Assistant | Admitting: Physician Assistant

## 2016-06-04 VITALS — BP 109/75 | HR 64 | Temp 98.3°F | Ht 63.0 in | Wt 230.6 lb

## 2016-06-04 DIAGNOSIS — Z975 Presence of (intrauterine) contraceptive device: Secondary | ICD-10-CM | POA: Insufficient documentation

## 2016-06-04 DIAGNOSIS — Z23 Encounter for immunization: Secondary | ICD-10-CM

## 2016-06-04 DIAGNOSIS — Z30431 Encounter for routine checking of intrauterine contraceptive device: Secondary | ICD-10-CM

## 2016-06-04 DIAGNOSIS — Z124 Encounter for screening for malignant neoplasm of cervix: Secondary | ICD-10-CM

## 2016-06-04 DIAGNOSIS — Z0000 Encounter for general adult medical examination without abnormal findings: Secondary | ICD-10-CM | POA: Insufficient documentation

## 2016-06-04 NOTE — Patient Instructions (Signed)

## 2016-06-04 NOTE — Progress Notes (Signed)
  Subjective:  Patient ID: [name] [name], female    DOB: 10/27/1980  Age: 36 y.o. MRN: [id number]  CC: annual exam  HPI [name] [name] is a 36 y.o. female with a PMH of left knee meniscus degeneration presents for annual physical. Feels well and is denies left knee pain after intraarticular corticosteroid injection performed by Dr. Luiz BlareGraves, orthopedics. Does not endorse any symptoms, has no complaints.    ROS Review of Systems  Constitutional: Negative for chills, fever and malaise/fatigue.  Eyes: Negative for blurred vision.  Respiratory: Negative for shortness of breath.   Cardiovascular: Negative for chest pain and palpitations.  Gastrointestinal: Negative for abdominal pain and nausea.  Genitourinary: Negative for dysuria and hematuria.  Musculoskeletal: Negative for joint pain (knee) and myalgias.  Skin: Negative for rash.  Neurological: Negative for tingling and headaches.  Psychiatric/Behavioral: Negative for depression. The patient is not nervous/anxious.     Objective:  BP 109/75 (BP Location: Left Arm, Patient Position: Sitting, Cuff Size: Large)   Pulse 64   Temp 98.3 F (36.8 C) (Oral)   Ht 5\' 3"  (1.6 m)   Wt 230 lb 9.6 oz (104.6 kg)   SpO2 96%   BMI 40.85 kg/m   BP/Weight 06/04/2016 05/16/2016  Systolic BP 109 99  Diastolic BP 75 70  Wt. (Lbs) 230.6 228.8  BMI 40.85 39.27      Physical Exam  Constitutional: She is oriented to person, place, and time.  Well developed, obese, NAD, polite  HENT:  Head: Normocephalic and atraumatic.  Eyes: No scleral icterus.  Neck: Normal range of motion. Neck supple. No thyromegaly present.  Cardiovascular: Normal rate, regular rhythm and normal heart sounds.   Pulmonary/Chest: Effort normal and breath sounds normal.  Abdominal: Soft. Bowel sounds are normal. There is no tenderness.  Genitourinary:  Genitourinary Comments: Viscous yellowish vaginal drainage. Cervix with small nabothian cyst. No cervical motion tenderness. IUD  strings not visualized. No adnexal mass or tenderness to palpation. Difficult to assess uterus due to obese abdomen.  Musculoskeletal: She exhibits no edema.  Bilateral LE and UE with full aROM. Back with full aROM.  Lymphadenopathy:    She has no cervical adenopathy.  Neurological: She is alert and oriented to person, place, and time. She has normal reflexes. No cranial nerve deficit. Coordination normal.  Skin: Skin is warm and dry. No rash noted. No erythema. No pallor.  Psychiatric: She has a normal mood and affect. Her behavior is normal. Thought content normal.  Vitals reviewed.    Assessment & Plan:   1. Annual physical exam - CBC with Differential - Comprehensive metabolic panel - Lipid Panel  2. Need for Tdap vaccination - Tdap vaccine greater than or equal to 7yo IM  3. Cervical cancer screening - PAP collected in clinic  4. IUD check up - Mirena strings not visualized. Reportedly in place 9 years. Advised to use condoms if she becomes sexually active until further advice from GYN.  - Ambulatory referral to Gynecology   No orders of the defined types were placed in this encounter.   Follow-up: Return in about 1 year (around 06/04/2017) for full physical.   Loletta Specteroger David Gomez PA

## 2016-06-05 LAB — COMPREHENSIVE METABOLIC PANEL
ALK PHOS: 58 IU/L (ref 39–117)
ALT: 12 IU/L (ref 0–32)
AST: 17 IU/L (ref 0–40)
Albumin/Globulin Ratio: 1.3 (ref 1.2–2.2)
Albumin: 3.9 g/dL (ref 3.5–5.5)
BUN/Creatinine Ratio: 14 (ref 9–23)
BUN: 9 mg/dL (ref 6–20)
Bilirubin Total: 0.3 mg/dL (ref 0.0–1.2)
CALCIUM: 8.9 mg/dL (ref 8.7–10.2)
CO2: 23 mmol/L (ref 18–29)
CREATININE: 0.66 mg/dL (ref 0.57–1.00)
Chloride: 105 mmol/L (ref 96–106)
GFR calc Af Amer: 133 mL/min/{1.73_m2} (ref >59)
GFR, EST NON AFRICAN AMERICAN: 116 mL/min/{1.73_m2} (ref >59)
GLUCOSE: 92 mg/dL (ref 65–99)
Globulin, Total: 2.9 g/dL (ref 1.5–4.5)
POTASSIUM: 4 mmol/L (ref 3.5–5.2)
Sodium: 141 mmol/L (ref 134–144)
Total Protein: 6.8 g/dL (ref 6.0–8.5)

## 2016-06-05 LAB — CBC WITH DIFFERENTIAL/PLATELET
BASOS ABS: 0 10*3/uL (ref 0.0–0.2)
Basos: 0 %
EOS (ABSOLUTE): 0.1 10*3/uL (ref 0.0–0.4)
EOS: 1 %
Hematocrit: 38.3 % (ref 34.0–46.6)
Hemoglobin: 12.6 g/dL (ref 11.1–15.9)
IMMATURE GRANULOCYTES: 0 %
Immature Grans (Abs): 0 10*3/uL (ref 0.0–0.1)
Lymphocytes Absolute: 2.5 10*3/uL (ref 0.7–3.1)
Lymphs: 43 %
MCH: 28.6 pg (ref 26.6–33.0)
MCHC: 32.9 g/dL (ref 31.5–35.7)
MCV: 87 fL (ref 79–97)
MONOS ABS: 0.3 10*3/uL (ref 0.1–0.9)
Monocytes: 5 %
NEUTROS PCT: 51 %
Neutrophils Absolute: 2.9 10*3/uL (ref 1.4–7.0)
PLATELETS: 171 10*3/uL (ref 150–379)
RBC: 4.41 x10E6/uL (ref 3.77–5.28)
RDW: 13.2 % (ref 12.3–15.4)
WBC: 5.8 10*3/uL (ref 3.4–10.8)

## 2016-06-05 LAB — LIPID PANEL
CHOL/HDL RATIO: 3.5 ratio (ref 0.0–4.4)
Cholesterol, Total: 163 mg/dL (ref 100–199)
HDL: 46 mg/dL (ref >39)
LDL CALC: 100 mg/dL — AB (ref 0–99)
TRIGLYCERIDES: 84 mg/dL (ref 0–149)
VLDL Cholesterol Cal: 17 mg/dL (ref 5–40)

## 2016-06-06 LAB — CYTOLOGY - PAP
Bacterial vaginitis: POSITIVE — AB
CHLAMYDIA, DNA PROBE: NEGATIVE
Candida vaginitis: POSITIVE — AB
DIAGNOSIS: NEGATIVE
NEISSERIA GONORRHEA: NEGATIVE
TRICH (WINDOWPATH): NEGATIVE

## 2016-06-07 ENCOUNTER — Other Ambulatory Visit (INDEPENDENT_AMBULATORY_CARE_PROVIDER_SITE_OTHER): Payer: Self-pay | Admitting: Physician Assistant

## 2016-06-07 DIAGNOSIS — N760 Acute vaginitis: Principal | ICD-10-CM

## 2016-06-07 DIAGNOSIS — B373 Candidiasis of vulva and vagina: Secondary | ICD-10-CM

## 2016-06-07 DIAGNOSIS — B9689 Other specified bacterial agents as the cause of diseases classified elsewhere: Secondary | ICD-10-CM | POA: Insufficient documentation

## 2016-06-07 DIAGNOSIS — B3731 Acute candidiasis of vulva and vagina: Secondary | ICD-10-CM

## 2016-06-07 MED ORDER — FLUCONAZOLE 150 MG PO TABS: 150.0000 mg | ORAL_TABLET | ORAL | 0 refills | Status: DC

## 2016-06-07 MED ORDER — METRONIDAZOLE 500 MG PO TABS: 500.0000 mg | ORAL_TABLET | Freq: Two times a day (BID) | ORAL | 0 refills | Status: AC

## 2016-06-07 NOTE — Progress Notes (Signed)
BV and yeast infection.

## 2016-06-11 ENCOUNTER — Encounter (INDEPENDENT_AMBULATORY_CARE_PROVIDER_SITE_OTHER): Payer: Self-pay

## 2016-06-11 LAB — CERVICOVAGINAL ANCILLARY ONLY: HERPES (WINDOWPATH): NEGATIVE

## 2016-07-01 ENCOUNTER — Encounter: Payer: Self-pay | Admitting: Obstetrics & Gynecology

## 2016-08-06 ENCOUNTER — Encounter: Payer: Self-pay | Admitting: Obstetrics & Gynecology

## 2016-08-06 ENCOUNTER — Ambulatory Visit (INDEPENDENT_AMBULATORY_CARE_PROVIDER_SITE_OTHER): Payer: Self-pay | Admitting: Obstetrics & Gynecology

## 2016-08-06 VITALS — BP 106/76 | HR 84 | Ht 64.0 in | Wt 236.9 lb

## 2016-08-06 DIAGNOSIS — Z30431 Encounter for routine checking of intrauterine contraceptive device: Secondary | ICD-10-CM

## 2016-08-06 NOTE — Patient Instructions (Addendum)
  Health Department information given to patient.  Return to clinic for any scheduled appointments or for any gynecologic concerns as needed.

## 2016-08-06 NOTE — Progress Notes (Signed)
   GYNECOLOGY OFFICE VISIT NOTE  History:  36 y.o. F here today for discussion about retained IUD; Mirena has been in place since 2010.  On exam by other provider, strings were not seen.  Cannot afford removal at this point in this office.  Has no symptoms; she denies any abnormal vaginal discharge, bleeding, pelvic pain or other concerns.  She is not sexually active, husband was deported to [place].   No past medical history on file.  No past surgical history on file.  The following portions of the patient's history were reviewed and updated as appropriate: allergies, current medications, past family history, past medical history, past social history, past surgical history and problem list.   Health Maintenance:  Normal pap on 06/04/2016.  Review of Systems:  Pertinent items noted in HPI and remainder of comprehensive ROS otherwise negative.   Objective:  Physical Exam BP 106/76   Pulse 84   Ht 5\' 4"  (1.626 m)   Wt 236 lb 14.4 oz (107.5 kg)   LMP  (LMP Unknown)   BMI 40.66 kg/m   CONSTITUTIONAL: Well-developed, well-nourished female in no acute distress.  CARDIOVASCULAR: Normal heart rate noted RESPIRATORY: Effort and breath sounds normal, no problems with respiration noted ABDOMEN: Soft, no distention noted.   PELVIC: Deferred MUSCULOSKELETAL: Normal range of motion. No edema noted.  Assessment & Plan:  1. IUD check up/retained IUD She was reassured this was not an emergency, but that if she does engage in sexual activity, she needs alternate contraception.Patient was referred to Health Department for IUD removal.   If strings are not seen, she can get affordable pelvic ultrasound there and may also have [facility].  Patient is agreeable to this plan.  Information for Health Department given to patient.   Total face-to-face time with patient: 20  minutes. Over 50% of encounter was spent on counseling and coordination of care.   Jaynie CollinsUGONNA  ANYANWU, MD,  FACOG Attending Obstetrician & Gynecologist, [facility], [facility]

## 2017-01-22 ENCOUNTER — Telehealth (INDEPENDENT_AMBULATORY_CARE_PROVIDER_SITE_OTHER): Payer: Self-pay | Admitting: Physician Assistant

## 2017-01-22 NOTE — Telephone Encounter (Signed)
Noted   Appointment 02-03-17 @ 11AM

## 2017-01-22 NOTE — Telephone Encounter (Signed)
Mrs [name] [name] friend Guerry MinorsCarolyn Royal  called because in a previous visit she talk to you regarding a Mental Evaluation for Mrs [name] [name] please, call her at [phone] Thank You

## 2017-01-22 NOTE — Telephone Encounter (Signed)
Have her schedule a visit to discuss medical issues.

## 2017-02-03 ENCOUNTER — Ambulatory Visit (INDEPENDENT_AMBULATORY_CARE_PROVIDER_SITE_OTHER): Payer: Self-pay | Admitting: Physician Assistant

## 2017-02-10 ENCOUNTER — Other Ambulatory Visit: Payer: Self-pay

## 2017-02-10 ENCOUNTER — Ambulatory Visit (INDEPENDENT_AMBULATORY_CARE_PROVIDER_SITE_OTHER): Payer: Self-pay | Admitting: Physician Assistant

## 2017-02-10 ENCOUNTER — Encounter (INDEPENDENT_AMBULATORY_CARE_PROVIDER_SITE_OTHER): Payer: Self-pay | Admitting: Physician Assistant

## 2017-02-10 VITALS — BP 121/83 | HR 71 | Temp 97.9°F | Ht 66.0 in | Wt 246.2 lb

## 2017-02-10 DIAGNOSIS — Z598 Other problems related to housing and economic circumstances: Secondary | ICD-10-CM

## 2017-02-10 DIAGNOSIS — M25561 Pain in right knee: Secondary | ICD-10-CM

## 2017-02-10 DIAGNOSIS — M545 Low back pain: Secondary | ICD-10-CM

## 2017-02-10 DIAGNOSIS — G8929 Other chronic pain: Secondary | ICD-10-CM

## 2017-02-10 DIAGNOSIS — Z599 Problem related to housing and economic circumstances, unspecified: Secondary | ICD-10-CM

## 2017-02-10 DIAGNOSIS — M25562 Pain in left knee: Secondary | ICD-10-CM

## 2017-02-10 MED ORDER — ACETAMINOPHEN 500 MG PO TABS: 1000.0000 mg | ORAL_TABLET | Freq: Three times a day (TID) | ORAL | 0 refills | Status: AC | PRN

## 2017-02-10 MED ORDER — NAPROXEN 500 MG PO TABS: 500.0000 mg | ORAL_TABLET | Freq: Two times a day (BID) | ORAL | 0 refills | Status: AC

## 2017-02-10 MED ORDER — CYCLOBENZAPRINE HCL 10 MG PO TABS: 10.0000 mg | ORAL_TABLET | Freq: Every day | ORAL | 1 refills | Status: AC

## 2017-02-10 MED FILL — NAPROXEN 500 MG TABLET: 500 | 30 days supply | Qty: 30 | Fill #0

## 2017-02-10 MED FILL — CYCLOBENZAPRINE 10 MG TAB: 10 | 15 days supply | Qty: 15 | Fill #0

## 2017-02-10 NOTE — Patient Instructions (Signed)
Community Resources  Advocacy/Legal Legal Aid [place]:  1-866-219-5262  /  336-272-0148  Family Justice Center:  336-641-7233  [facility] [place] 24-hr Crisis line:  336-273-7273  Women's Resource Center, [place]:  336-275-6090  Court Watch (custody):  336-275-2346  [place] [facility]:   336-279-9299    Baby & Breastfeeding Car Seat Inspection @ Various [place] Fire Depts.- call 336-373-2177  [place] [facility]  336-832-6860  [facility] [place] [facility] 336-878-6712  WIC: 336-641-3663 ([place]);  336-641-7571 ([place])  La Leche League:  1-877-452-5321   Childcare [place] [facility]: 336-369-5097 ([place]) / 336-887-8224 ([place])  - Child Care Resources/ Referrals/ Scholarships  - Head Start/ Early Head Start (call or apply online)  [place] DHHS: [place] Pre-[place] :  1-800-859-0829 / 336-274-5437   Employment / Job Search [facility] [place]: 336-275-6090 / [address]  [employer] (JobLink): 336-373-5922 ([place]) / 336-882-4141 ([place])  [facility] [employer] Community Resource/ Career Center: 336-275-9801 / 336-282-7307  [place] [facility] Job & Career Center: 336-373-3764  DHHS Work First: 336-641-3447 ([place]) / 336-641-3447 ([place])  [facility] [place]:  336-676-5871   Financial Assistance [place] [facility]:  336-553-2657  [employer]: 336-235-0368  [employer] (furniture):  336-370-4002  [place] [facility] Hands: 336-373-4264  Low Income Energy Assistance  336-641-3000   Food Assistance DHHS- SNAP/ Food Stamps: 336-641-4588  WIC: [place]- 336-641-3663 ;  [place] 336-641-7571  Little Green Book- Free Meals  Little Blue Book- Free Food Pantries  During the summer, text "FOOD" to 877877   General Health / Clinics (Adults) Orange Card (for Adults) through [place] [facility]: (336) 895-4900  [facility]:   336-832-8035  [place] [facility]:   336-832-4444  Health Department:  336-641-3245  [place]:  336-415-3877 / 336-641-2100  Planned Parenthood of [place]:   336-373-0678  [facility]:   336-334-4822 x 50251   Housing [place] Housing Coalition:   336-691-9521  [place] Housing Authority:  336-275-8501  Affordable Housing Managemnt:  336-273-0568   Immigrant/ Refugee Center for [place]ians (UNCG):  336-256-1065  Faith [employer] [facility]:  336-379-0037  [employer]:  336-937-4701  Church World Services:  336-617-0381  African Services Coalition:  336-574-2677   LGBTQ YouthSAFE  www.youthsafe[place].org  PFLAG  336-541-6754 / info@[place].org  The Trevor Project:  1-866-488-7386   Mental Health/ Substance Use [facility] [place]  336-387-6161   Health:  336-832-9700 or 1-800-711-2635  [place] Circle of Care:  336-271-5888  [facility]:  336-294-1349  [place] [facility]:  336-542-2884  [facility] (walk-ins)  336-676-6840 / [address]  [place]:  800-449-1287  Alcoholics Anonymous:  336-854-4278  Narcotics Anonymous:  800-365-1036  Quit Smoking Hotline:  800-QUIT-NOW (800-784-8669)   Parenting Children's Home Society:  800-632-1400  : Education Center & Support Groups:  336-832-6682  YWCA: 336-273-3461  [employer]CG: Bringing Out the Best:  336-334-3120               Thriving at Three (Hispanic families): 336-256-1066  Healthy Start ([facility] [place]):  336-387-6161 x2288  Parents as Teachers:  336-691-0024  [place] Child Development- Learning Together (Immigrants): 336-369-5001   Poison Control 800-222-1222  Sports & Recreation [employer] Open Doors Application: ymcanwnc.org/join/open-doors-financial-assistance/  City of [place] Recreation Centers: http://www.[place]-.gov/index.aspx?page=3615   Special Needs Family Support Network:  336-832-6507  Autism Society of :   336-333-0197 x1402 or x1412 /  800-785-1035  TEACCH [place]:  336-334-5773     ARC of [place]:  336-373-1076  Children's Developmental Service Agency (CDSA):  336-334-5601  CC4C (Care Coordination for Children):  336-641-7641   Transportation Medicaid Transportation: 336-641-4848 to apply  [place] Transit Authority: 336-335-6499 (reduced-fare bus ID to Medicaid/ Medicare/ Orange Card)  SCAT Paratransit services: Eligible riders only, call 336-333-6589 for application   Tutoring/Mentoring Black Child Development Institute: 336-230-2138  Big Brothers/ Big Sisters: 336-378-9100 ([place])  336-882-4167 ([place])  ACES through child's school: 336-370-2321  [employer] Achievers: contact your local Y  SHIELD Mentor Program: 336-337-2771   

## 2017-02-10 NOTE — Progress Notes (Signed)
Subjective:  Patient ID: [name] RoutAkech Creps, female    DOB: 10/11/1980  Age: 37 y.o. MRN: [id number]  CC: need referral to GYN and psychiatry  HPI  [name] [name] is a 37 y.o. female with a PMH of left knee meniscus degeneration presents with a friend/advocate that is concerned about patient having missed work over the Thanksgiving season. Pt was fired because she did not call to announce her absence. Patient said she did not call because she felt the job was too taxing on her body. Patient denies feeling depressed or anxious. PHQ9 and GAD7 scores are both zero. Patient advocate reports two other similar occurrences in which patient was fired for not calling in for an absence. Pt cites miscommunication/lack of understanding in at least one of the cases.     Patient complains of chronic bilateral knee and back pain.      ROS Review of Systems  Constitutional: Negative for chills, fever and malaise/fatigue.  Eyes: Negative for blurred vision.  Respiratory: Negative for shortness of breath.   Cardiovascular: Negative for chest pain and palpitations.  Gastrointestinal: Negative for abdominal pain and nausea.  Genitourinary: Negative for dysuria and hematuria.  Musculoskeletal: Positive for back pain and joint pain. Negative for myalgias.  Skin: Negative for rash.  Neurological: Negative for tingling and headaches.  Psychiatric/Behavioral: Negative for depression. The patient is not nervous/anxious.     Objective:  BP 121/83 (BP Location: Right Arm, Patient Position: Sitting, Cuff Size: Large)   Pulse 71   Temp 97.9 F (36.6 C) (Oral)   Ht 5\' 6"  (1.676 m)   Wt 246 lb 3.2 oz (111.7 kg)   SpO2 95%   BMI 39.74 kg/m   BP/Weight 02/10/2017 08/06/2016 06/04/2016  Systolic BP 121 106 109  Diastolic BP 83 76 75  Wt. (Lbs) 246.2 236.9 230.6  BMI 39.74 40.66 40.85      Physical Exam  Constitutional: She is oriented to person, place, and time.  Well developed, obese, NAD, polite  HENT:  Head:  Normocephalic and atraumatic.  Eyes: No scleral icterus.  Pulmonary/Chest: Effort normal.  Musculoskeletal: She exhibits no edema.  Knee without edema, erythema, or ecchymosis.   Neurological: She is alert and oriented to person, place, and time. No cranial nerve deficit. Coordination normal.  Skin: Skin is warm and dry. No rash noted. No erythema. No pallor.  Psychiatric: She has a normal mood and affect. Her behavior is normal. Thought content normal.  Vitals reviewed.    Assessment & Plan:     1. Financial difficulties - extensive time spent listening to patient advocate concerns. I have printed information containing the numbers to several organizations that can be used as resources for assistance. Assistance includes [facility].  - Inconclusive as to whether or not patient truly has psychiatric condition. There may be cultural or personal misunderstandings but patient should be further assessed by psychologist as there may be some other deep rooted issues to address.   2. Chronic bilateral low back pain without sciatica - naproxen (NAPROSYN) 500 MG tablet; Take 1 tablet (500 mg total) by mouth 2 (two) times daily with a meal.  Dispense: 30 tablet; Refill: 0 - acetaminophen (TYLENOL) 500 MG tablet; Take 2 tablets (1,000 mg total) by mouth every 8 (eight) hours as needed for up to 7 days.  Dispense: 21 tablet; Refill: 0 - cyclobenzaprine (FLEXERIL) 10 MG tablet; Take 1 tablet (10 mg total) by mouth at bedtime.  Dispense: 15 tablet; Refill: 1  3. Chronic pain of both knees - naproxen (NAPROSYN) 500 MG tablet; Take 1 tablet (500 mg total) by mouth 2 (two) times daily with a meal.  Dispense: 30 tablet; Refill: 0 - acetaminophen (TYLENOL) 500 MG tablet; Take 2 tablets (1,000 mg total) by mouth every 8 (eight) hours as needed for up to 7 days.  Dispense: 21 tablet; Refill: 0 - cyclobenzaprine (FLEXERIL) 10 MG tablet; Take 1 tablet (10 mg total) by mouth at bedtime.   Dispense: 15 tablet; Refill: 1   Meds ordered this encounter  Medications  . naproxen (NAPROSYN) 500 MG tablet    Sig: Take 1 tablet (500 mg total) by mouth 2 (two) times daily with a meal.    Dispense:  30 tablet    Refill:  0    Order Specific Question:   Supervising Provider    Answer:   Quentin Angst [id number]  . acetaminophen (TYLENOL) 500 MG tablet    Sig: Take 2 tablets (1,000 mg total) by mouth every 8 (eight) hours as needed for up to 7 days.    Dispense:  21 tablet    Refill:  0    Order Specific Question:   Supervising Provider    Answer:   Quentin Angst [id number]  . cyclobenzaprine (FLEXERIL) 10 MG tablet    Sig: Take 1 tablet (10 mg total) by mouth at bedtime.    Dispense:  15 tablet    Refill:  1    Order Specific Question:   Supervising Provider    Answer:   Quentin Angst [id number]    Follow-up: Return in about 6 weeks (around 03/24/2017).   Loletta Specter PA

## 2017-03-24 ENCOUNTER — Ambulatory Visit (INDEPENDENT_AMBULATORY_CARE_PROVIDER_SITE_OTHER): Payer: Self-pay | Admitting: Physician Assistant
